# Patient Record
Sex: Male | Born: 1995 | Race: White | Hispanic: No | Marital: Single | Smoking: Never smoker
Health system: Southern US, Community
[De-identification: ages and names within clinical notes are randomized; demographics above are authoritative.]

## PROBLEM LIST (undated history)

## (undated) DIAGNOSIS — Z9889 Other specified postprocedural states: Secondary | ICD-10-CM

## (undated) DIAGNOSIS — R51 Headache: Secondary | ICD-10-CM

## (undated) DIAGNOSIS — R519 Headache, unspecified: Secondary | ICD-10-CM

## (undated) DIAGNOSIS — S62339A Displaced fracture of neck of unspecified metacarpal bone, initial encounter for closed fracture: Secondary | ICD-10-CM

## (undated) DIAGNOSIS — R112 Nausea with vomiting, unspecified: Secondary | ICD-10-CM

## (undated) HISTORY — DX: Headache: R51

## (undated) HISTORY — PX: CIRCUMCISION: SUR203

## (undated) HISTORY — PX: TYMPANOSTOMY TUBE PLACEMENT: SHX32

## (undated) HISTORY — DX: Headache, unspecified: R51.9

---
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
## 2016-04-22 ENCOUNTER — Ambulatory Visit (INDEPENDENT_AMBULATORY_CARE_PROVIDER_SITE_OTHER): Payer: Medicaid Other | Admitting: Pediatrics

## 2016-04-22 ENCOUNTER — Encounter: Payer: Self-pay | Admitting: Pediatrics

## 2016-04-22 DIAGNOSIS — G44219 Episodic tension-type headache, not intractable: Secondary | ICD-10-CM

## 2016-04-22 DIAGNOSIS — G43109 Migraine with aura, not intractable, without status migrainosus: Secondary | ICD-10-CM | POA: Insufficient documentation

## 2016-04-22 DIAGNOSIS — G43001 Migraine without aura, not intractable, with status migrainosus: Secondary | ICD-10-CM | POA: Diagnosis not present

## 2016-04-22 MED ORDER — SUMATRIPTAN SUCCINATE 50 MG PO TABS: ORAL_TABLET | ORAL | Status: AC

## 2016-04-22 NOTE — Patient Instructions (Signed)
There are 3 lifestyle behaviors that are important to minimize headaches.  You should sleep 8 hours at night time.  Bedtime should be a set time for going to bed and waking up with few exceptions.  You need to drink about 48 ounces of water per day, more on days when you are out in the heat.  This works out to 3 - 16 ounce water bottles per day.  You may need to flavor the water so that you will be more likely to drink it.  Do not use Kool-Aid or other sugar drinks because they add empty calories and actually increase urine output.  You need to eat 3 meals per day.  You should not skip meals.  The meal does not have to be a big one.  Make daily entries into the headache calendar and sent it to me at the end of each calendar month.  I will call you or your parents and we will discuss the results of the headache calendar and make a decision about changing treatment if indicated.  You should take 600 mg of ibuprofen with 50 mg of sumatriptan at the onset of headaches that are severe enough to cause obvious pain and other symptoms.

## 2016-04-22 NOTE — Progress Notes (Signed)
Patient: Anthony Brown MRN: [id number] Sex: male DOB: 09-10-1996  Provider: Deetta Perla, MD Location of Care: [facility]  Note type: Routine return visit  History of Present Illness: Referral Source: Anthony Speak, MD History from: mother, patient and [facility] chart Chief Complaint: Headaches  [name] is a 20 y.o. male who returns on Apr 22, 2016, for the first time since November 02, 2014.  [name] has migraine without aura, tension-type headaches, and had a concussion since he was last seen.  His migraines have been infrequent.  The ones that he can remember happened as he was getting ready to start school in August 2016, one in early May 2017, as he was going into finals, and one after he got out of school.  His headaches last for three to four days.  They are worst initially and then gradually decline over time.  They are characterized by pounding and stabbing pain that is retroorbital, in his temples or on his brows.  He has sensitivity to light and sound.  Movement has caused either loss of vision or syncope on occasion.  His headaches begin with blurred vision and he cannot process well.  This occurs before he has a headache.  At the point that he has sensitivity to light, he is unable to read.  Medication has not helped.  He has to lie down and rest and that still is not useful in abolishing his headache.  It just has to go away on its own.  Fortunately, the episodes are not frequent.  Once or twice a week, he has a frontal headache that is dull and constant in nature.  He has none of the other symptoms he has with migraines.  He is uncomfortable, but is not found relief from over-the-counter medications.  In May 2016, he had a collision while playing lacrosse in his final game.  He was stunned and went out of the game temporarily and then finished the game.  He then had two weeks of continuous severe headaches.  He just finished his freshman  year at [employer] and had A's and B's in his second semester.  He did apparently much better than the first semester, which shows that he has learned how to adapt through the college.  He gained 8 pounds since I saw him nearly 18 months ago.  He has returned because the migraines were severe and prolonged.  He had status migrainosus when I saw him in December 2015.  This apparently has happened again on three separate occasions.  Review of Systems: 12 system review was assessed and was negative  Past Medical History Diagnosis Date  . Fracture, metacarpal, neck 01/2014    right small  . Post-operative nausea and vomiting   . Headache    Hospitalizations: No., Head Injury: Yes.   Concussion 2016, Nervous System Infections: No., Immunizations up to date: Yes.    Birth History 7 lbs. 13 oz. infant born at [redacted] weeks gestational age to a 20 year old g 3 p 1 1 0 2 male. Gestation was complicated by deep vein thrombosis in her legs requiring subcutaneous heparin Primary cesarean section for dystocia. His arm was extended above his head, and he developed fetal distress Nursery Course was uncomplicated Growth and Development was recalled as problems with articulation requiring speech therapy in preschool  Behavior History none  Surgical History Procedure Laterality Date  . Tympanostomy tube placement  2002 and 2004    x 2  .  Tonsillectomy and adenoidectomy  2002  . Dental surgery  06/28/11    12 Teeth removed   . Open reduction internal fixation (orif) metacarpal Right 02/15/2014    Procedure: CLOSED REDUCTION METACARPAL WITH PERCUTANEOUS PINNING ;  Surgeon: Tami RibasKevin R Kuzma, MD;  Location: [facility];  Service: Orthopedics;  Laterality: Right;  . Circumcision  1998   Family History family history includes Anesthesia problems in his maternal grandmother; COPD in his paternal grandmother; Diabetes in his father; Heart disease in his maternal grandfather; Hypertension  in his maternal grandfather and maternal grandmother; Kidney cancer in his maternal grandfather; Lung cancer in his maternal grandfather; Migraines in his maternal aunt; Thyroid cancer in his mother. Family history is negative for seizures, intellectual disabilities, blindness, deafness, birth defects, chromosomal disorder, or autism.  Social History . Marital Status: Single    Spouse Name: N/A  . Number of Children: N/A  . Years of Education: N/A   Social History Main Topics  . Smoking status: Never Smoker   . Smokeless tobacco: Never Used  . Alcohol Use: No  . Drug Use: No  . Sexual Activity: Yes   Social History Narrative    Anthony Brown is now a sophomore at [employer]; he does well in school. He lives with his mother, grandmother, and brother. He enjoys music, acting, lacrosse, hiking, and camping.    No Known Allergies  Physical Exam BP 100/62 mmHg  Pulse 96  Ht 5\' 11"  (1.803 m)  Wt 254 lb 6.4 oz (115.395 kg)  BMI 35.50 kg/m2  General: alert, well developed, well nourished, in no acute distress, brown hair, blue eyes, right handed Head: normocephalic, no dysmorphic features Ears, Nose and Throat: Otoscopic: tympanic membranes normal; pharynx: oropharynx is pink without exudates or tonsillar hypertrophy Neck: supple, full range of motion, no cranial or cervical bruits Respiratory: auscultation clear Cardiovascular: no murmurs, pulses are normal Musculoskeletal: no skeletal deformities or apparent scoliosis Skin: no rashes or neurocutaneous lesions  Neurologic Exam  Mental Status: alert; oriented to person, place and year; knowledge is normal for age; language is normal Cranial Nerves: visual fields are full to double simultaneous stimuli; extraocular movements are full and conjugate; pupils are round reactive to light; funduscopic examination shows sharp disc margins with normal vessels; symmetric facial strength; midline tongue and uvula; air conduction is greater than bone  conduction bilaterally Motor: Normal strength, tone and mass; good fine motor movements; no pronator drift Sensory: intact responses to cold, vibration, proprioception and stereognosis Coordination: good finger-to-nose, rapid repetitive alternating movements and finger apposition Gait and Station: normal gait and station: patient is able to walk on heels, toes and tandem without difficulty; balance is adequate; Romberg exam is negative; Gower response is negative Reflexes: symmetric and diminished bilaterally; no clonus; bilateral flexor plantar responses  Assessment 1. Migraine without aura and with status migrainosus, not intractable, G43.001. 2. Episodic tension-type headache, not intractable, G44.219.  Discussion The fact that Anthony Brown has three to four-day migraine headaches suggests status migrainosus.  I am pleased that they are not frequent enough that preventative treatment is necessary, although if he continues to have a couple of prolonged episodes each month, he might benefit from that.  I suggested giving him sumatriptan 50 mg at the onset of his migraine to see if we can abort it and shorten it to a few hours from a few days.  If that fails, we may consider long-acting medicines like Frova or Amerge.  Plan Anthony Brown will return to see me  in October 2017, when he is on fall break.  I will be happy to see him sooner based on clinical need.  I asked him to keep calendars so that he can inform me if his headaches are worsening.  I recommended that he sign up for My Chart so that we can keep in touch when he is at school.  I spent 40 minutes of face-to-face time with Toran and his mother, more than half of it in consultation.   Medication List   This list is accurate as of: 04/22/16  3:20 PM.       SUMAtriptan 50 MG tablet  Commonly known as:  IMITREX  Take one tablet at onset of migraine with 600 mg of ibuprofen, may repeat in 2 hours if headache persists or recurs.      The medication  list was reviewed and reconciled. All changes or newly prescribed medications were explained.  A complete medication list was provided to the patient/caregiver.  Deetta Perla MD

## 2016-12-08 ENCOUNTER — Encounter (INDEPENDENT_AMBULATORY_CARE_PROVIDER_SITE_OTHER): Payer: Self-pay | Admitting: *Deleted
